# Patient Record
Sex: Male | Born: 1985
Health system: Southern US, Community
[De-identification: ages and names within clinical notes are randomized; demographics above are authoritative.]

## PROBLEM LIST (undated history)

## (undated) DIAGNOSIS — E079 Disorder of thyroid, unspecified: Secondary | ICD-10-CM

## (undated) HISTORY — DX: Disorder of thyroid, unspecified: E07.9

---
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
## 2010-06-02 ENCOUNTER — Ambulatory Visit: Payer: Self-pay | Admitting: Family Medicine

## 2013-11-26 ENCOUNTER — Other Ambulatory Visit: Payer: Self-pay | Admitting: Physician Assistant

## 2013-11-26 DIAGNOSIS — E041 Nontoxic single thyroid nodule: Secondary | ICD-10-CM

## 2013-11-27 ENCOUNTER — Ambulatory Visit
Admission: RE | Admit: 2013-11-27 | Discharge: 2013-11-27 | Disposition: A | Discharge status: Home / Self Care | Payer: BC Managed Care – PPO | Source: Ambulatory Visit | Attending: Physician Assistant | Admitting: Physician Assistant

## 2013-11-27 DIAGNOSIS — E041 Nontoxic single thyroid nodule: Secondary | ICD-10-CM

## 2014-11-24 IMAGING — US US SOFT TISSUE HEAD/NECK
1 series · 14 of 25 positions shown · non-contrast
Comparison: None.

CLINICAL DATA: Thyroid nodule and hypothyroidism.

EXAM:
THYROID ULTRASOUND
TECHNIQUE: Ultrasound examination of the thyroid gland and adjacent soft
tissues was performed.

[Series 1: us soft tissue head/neck · 0.06mm/px · 14 of 47 slices shown]
[im 1/47]
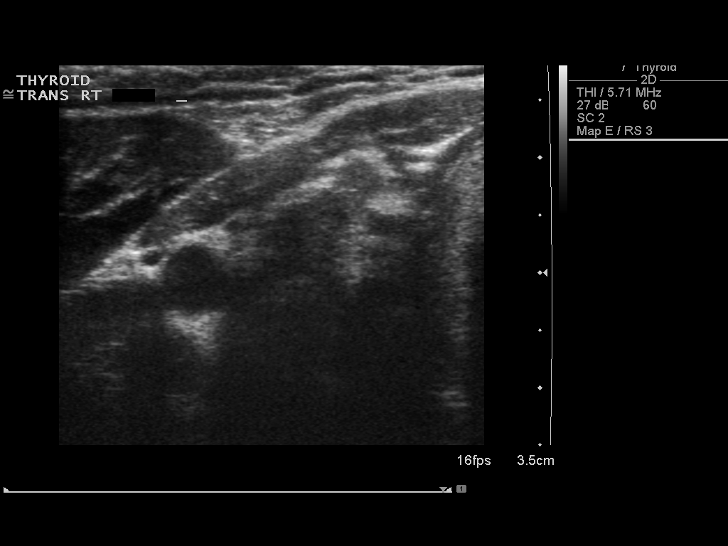
[im 4/47]
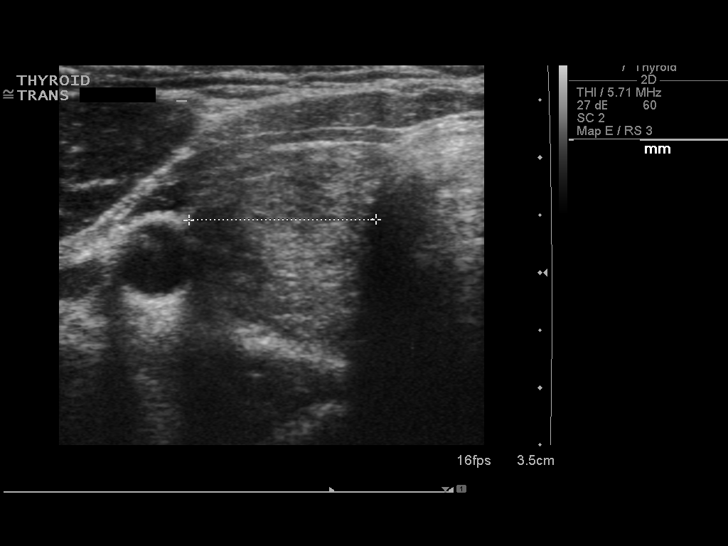
[im 8/47]
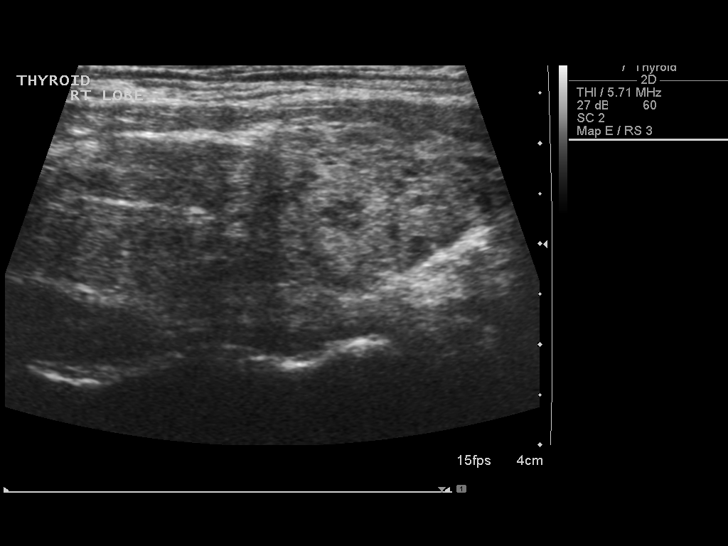
[im 12/47]
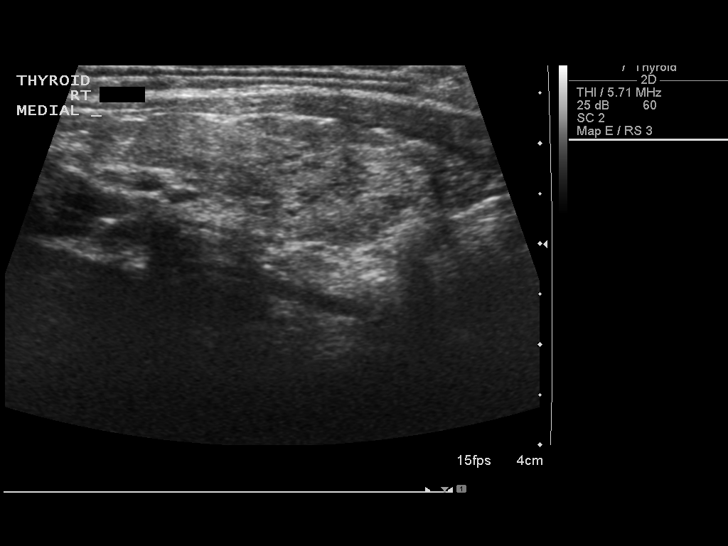
[im 16/47]
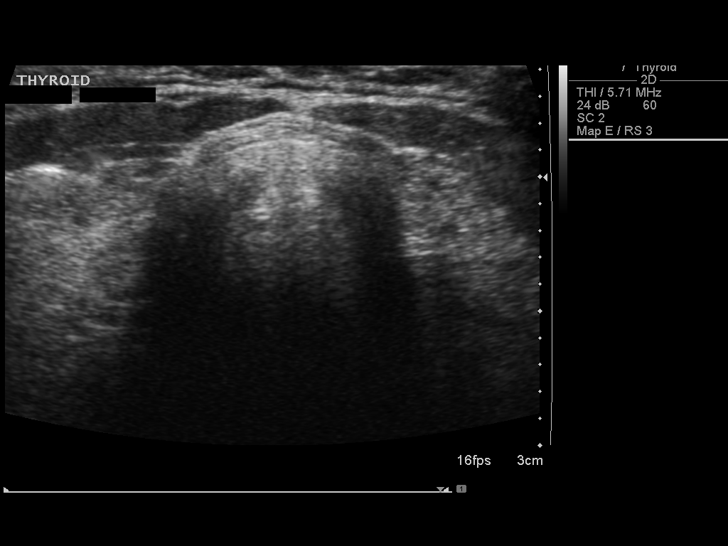
[im 18/47]
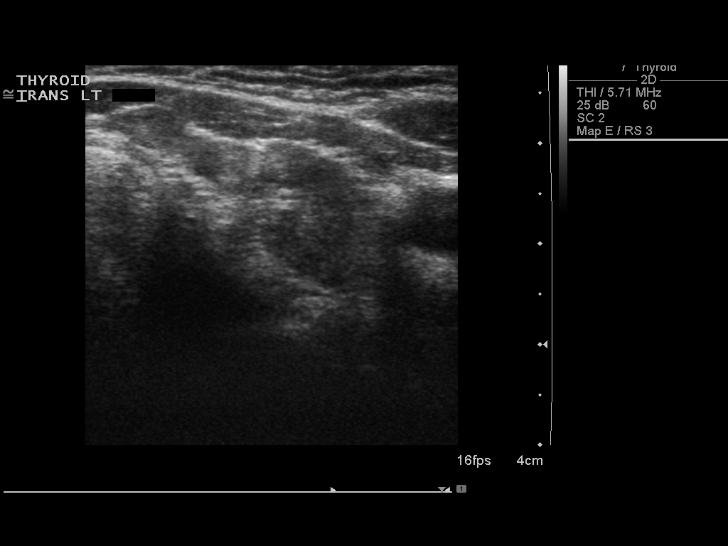
[im 22/47]
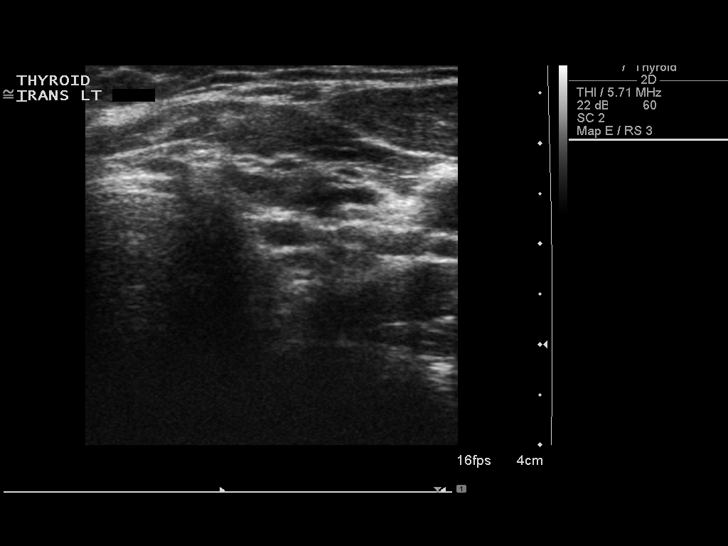
[im 25/47]
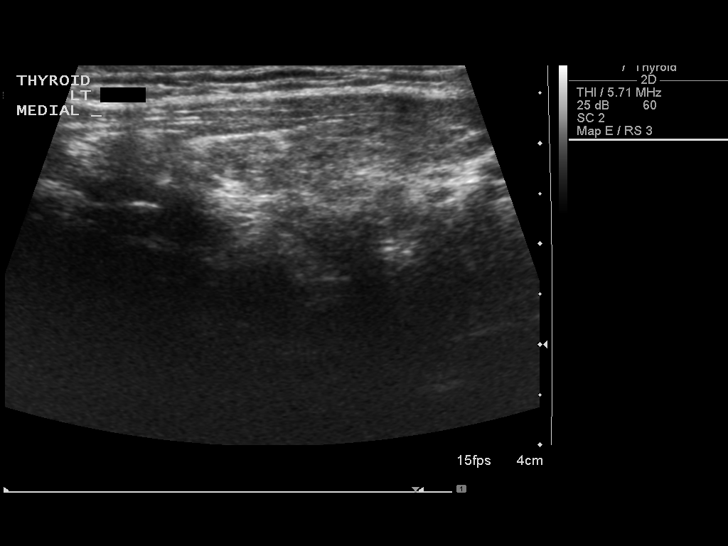
[im 29/47]
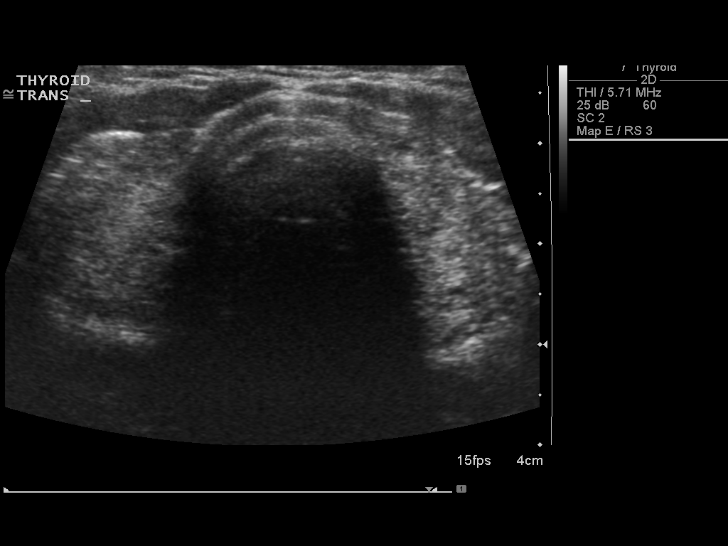
[im 31/47]
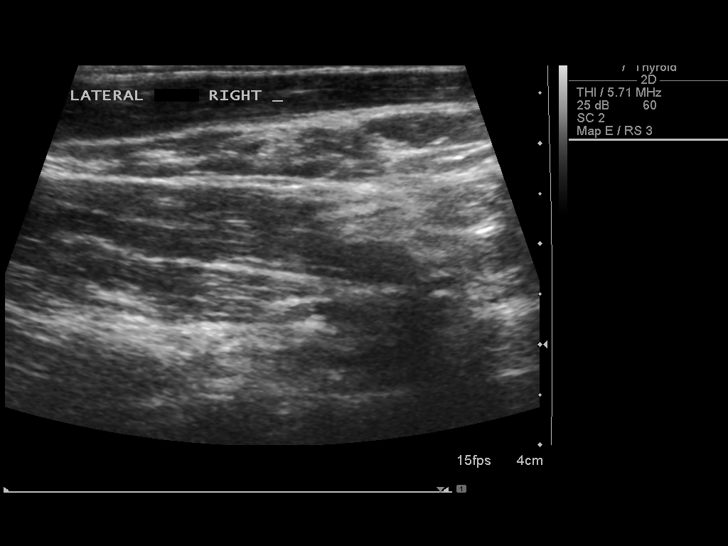
[im 35/47]
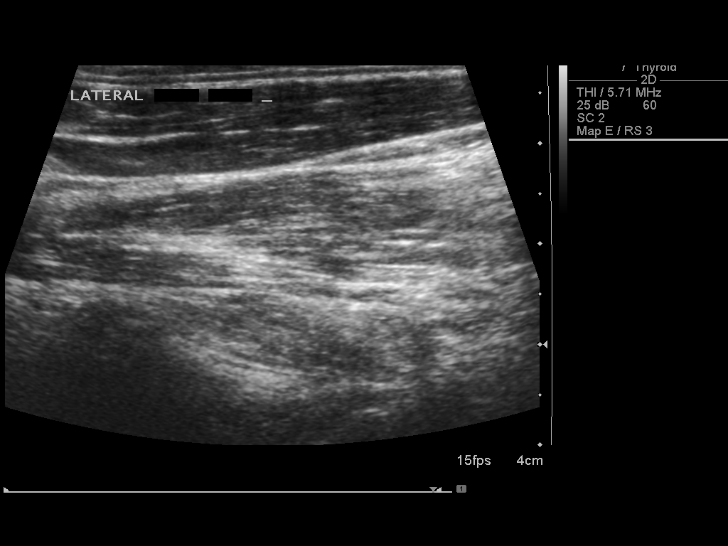
[im 39/47]
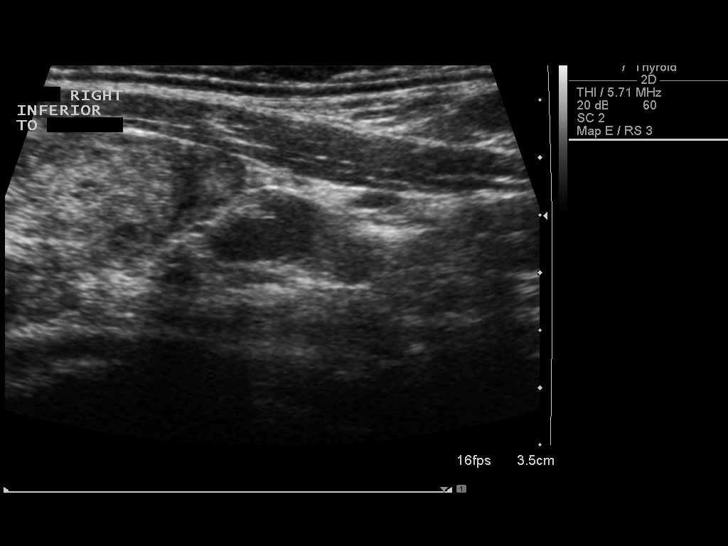
[im 43/47]
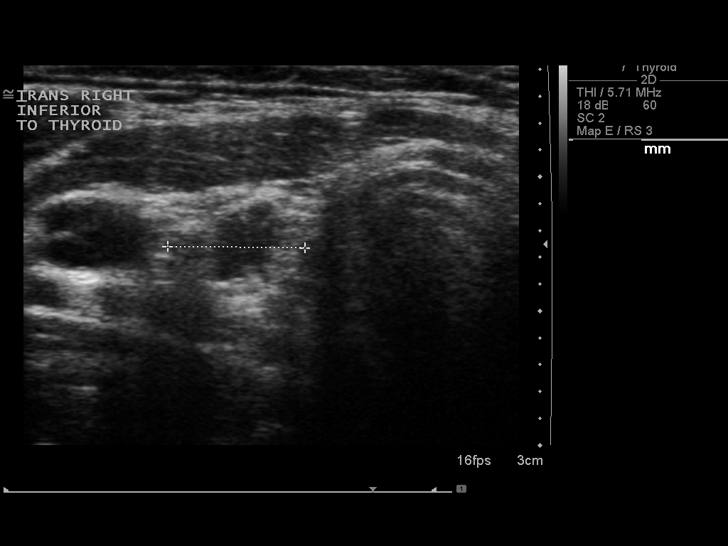
[im 47/47]
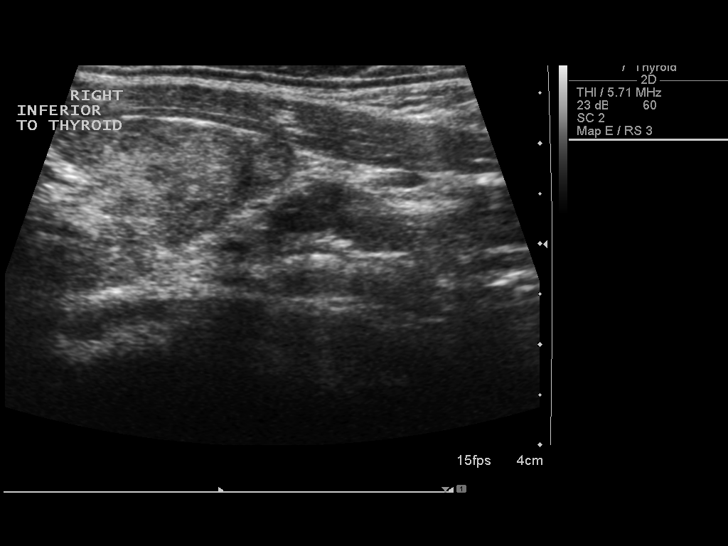

[14 of 25 positions shown; findings below may reference images not displayed]

FINDINGS: Right thyroid lobe

Measurements: 6.5 x 2.0 x 1.6 cm. Thyroid echotexture is
heterogeneous. No solid or cystic nodules are identified.

Left thyroid lobe

Measurements: 4.9 x 1.8 x 0 2.0 cm. Thyroid echotexture is
heterogeneous. No solid or cystic nodules are identified.

Isthmus

Thickness: 0.1 cm.  No nodules visualized.

Lymphadenopathy

None visualized.

Hypoechoic nodule just inferior to the right lobe of the thyroid
gland measures approximately 1.4 x 0.7 x 1.0 cm. This likely
represents a lymph node. This is less likely a parathyroid
gland/nodule.
IMPRESSION: Unremarkable thyroid ultrasound with no thyroid nodules or cysts
identified. Soft tissue nodule just inferior to the right lobe
likely represents a small lymph node.

## 2017-04-10 DIAGNOSIS — E785 Hyperlipidemia, unspecified: Secondary | ICD-10-CM | POA: Diagnosis not present

## 2017-04-10 DIAGNOSIS — E039 Hypothyroidism, unspecified: Secondary | ICD-10-CM | POA: Diagnosis not present

## 2017-04-10 DIAGNOSIS — Z0000 Encounter for general adult medical examination without abnormal findings: Secondary | ICD-10-CM | POA: Diagnosis not present

## 2017-12-25 DIAGNOSIS — E7800 Pure hypercholesterolemia, unspecified: Secondary | ICD-10-CM | POA: Diagnosis not present

## 2017-12-25 DIAGNOSIS — E039 Hypothyroidism, unspecified: Secondary | ICD-10-CM | POA: Diagnosis not present

## 2017-12-25 DIAGNOSIS — E291 Testicular hypofunction: Secondary | ICD-10-CM | POA: Diagnosis not present

## 2017-12-25 DIAGNOSIS — R7989 Other specified abnormal findings of blood chemistry: Secondary | ICD-10-CM | POA: Diagnosis not present

## 2018-01-16 DIAGNOSIS — E291 Testicular hypofunction: Secondary | ICD-10-CM | POA: Diagnosis not present

## 2018-01-16 DIAGNOSIS — R5381 Other malaise: Secondary | ICD-10-CM | POA: Diagnosis not present

## 2018-01-16 DIAGNOSIS — R5383 Other fatigue: Secondary | ICD-10-CM | POA: Diagnosis not present

## 2018-01-16 DIAGNOSIS — E039 Hypothyroidism, unspecified: Secondary | ICD-10-CM | POA: Diagnosis not present

## 2018-01-16 DIAGNOSIS — E559 Vitamin D deficiency, unspecified: Secondary | ICD-10-CM | POA: Diagnosis not present

## 2018-01-16 DIAGNOSIS — E663 Overweight: Secondary | ICD-10-CM | POA: Diagnosis not present

## 2018-02-13 DIAGNOSIS — E291 Testicular hypofunction: Secondary | ICD-10-CM | POA: Diagnosis not present

## 2018-02-13 DIAGNOSIS — R5383 Other fatigue: Secondary | ICD-10-CM | POA: Diagnosis not present

## 2018-02-13 DIAGNOSIS — E063 Autoimmune thyroiditis: Secondary | ICD-10-CM | POA: Diagnosis not present

## 2018-02-13 DIAGNOSIS — E559 Vitamin D deficiency, unspecified: Secondary | ICD-10-CM | POA: Diagnosis not present

## 2018-02-13 DIAGNOSIS — E663 Overweight: Secondary | ICD-10-CM | POA: Diagnosis not present

## 2018-09-18 DIAGNOSIS — E063 Autoimmune thyroiditis: Secondary | ICD-10-CM | POA: Diagnosis not present

## 2018-09-18 DIAGNOSIS — R5383 Other fatigue: Secondary | ICD-10-CM | POA: Diagnosis not present

## 2018-09-18 DIAGNOSIS — E291 Testicular hypofunction: Secondary | ICD-10-CM | POA: Diagnosis not present

## 2018-09-18 DIAGNOSIS — E663 Overweight: Secondary | ICD-10-CM | POA: Diagnosis not present

## 2018-09-18 DIAGNOSIS — E559 Vitamin D deficiency, unspecified: Secondary | ICD-10-CM | POA: Diagnosis not present

## 2018-10-09 DIAGNOSIS — E291 Testicular hypofunction: Secondary | ICD-10-CM | POA: Diagnosis not present

## 2018-10-09 DIAGNOSIS — R5383 Other fatigue: Secondary | ICD-10-CM | POA: Diagnosis not present

## 2018-10-09 DIAGNOSIS — E063 Autoimmune thyroiditis: Secondary | ICD-10-CM | POA: Diagnosis not present

## 2018-10-09 DIAGNOSIS — E559 Vitamin D deficiency, unspecified: Secondary | ICD-10-CM | POA: Diagnosis not present

## 2018-10-26 DIAGNOSIS — Z23 Encounter for immunization: Secondary | ICD-10-CM | POA: Diagnosis not present

## 2019-01-02 DIAGNOSIS — Z0000 Encounter for general adult medical examination without abnormal findings: Secondary | ICD-10-CM | POA: Diagnosis not present

## 2019-01-02 DIAGNOSIS — E785 Hyperlipidemia, unspecified: Secondary | ICD-10-CM | POA: Diagnosis not present

## 2019-09-02 DIAGNOSIS — E063 Autoimmune thyroiditis: Secondary | ICD-10-CM | POA: Diagnosis not present

## 2019-09-02 DIAGNOSIS — R5383 Other fatigue: Secondary | ICD-10-CM | POA: Diagnosis not present

## 2019-09-02 DIAGNOSIS — E559 Vitamin D deficiency, unspecified: Secondary | ICD-10-CM | POA: Diagnosis not present

## 2019-09-02 DIAGNOSIS — E663 Overweight: Secondary | ICD-10-CM | POA: Diagnosis not present

## 2019-09-02 DIAGNOSIS — E291 Testicular hypofunction: Secondary | ICD-10-CM | POA: Diagnosis not present

## 2019-09-16 DIAGNOSIS — R5383 Other fatigue: Secondary | ICD-10-CM | POA: Diagnosis not present

## 2019-09-16 DIAGNOSIS — E559 Vitamin D deficiency, unspecified: Secondary | ICD-10-CM | POA: Diagnosis not present

## 2019-09-16 DIAGNOSIS — E063 Autoimmune thyroiditis: Secondary | ICD-10-CM | POA: Diagnosis not present

## 2019-09-16 DIAGNOSIS — E291 Testicular hypofunction: Secondary | ICD-10-CM | POA: Diagnosis not present

## 2019-11-21 DIAGNOSIS — Z20822 Contact with and (suspected) exposure to covid-19: Secondary | ICD-10-CM | POA: Diagnosis not present

## 2020-01-05 DIAGNOSIS — R0981 Nasal congestion: Secondary | ICD-10-CM | POA: Diagnosis not present

## 2020-01-05 DIAGNOSIS — Z0000 Encounter for general adult medical examination without abnormal findings: Secondary | ICD-10-CM | POA: Diagnosis not present

## 2020-01-05 DIAGNOSIS — E785 Hyperlipidemia, unspecified: Secondary | ICD-10-CM | POA: Diagnosis not present

## 2020-01-05 DIAGNOSIS — R21 Rash and other nonspecific skin eruption: Secondary | ICD-10-CM | POA: Diagnosis not present

## 2020-09-27 DIAGNOSIS — N469 Male infertility, unspecified: Secondary | ICD-10-CM | POA: Diagnosis not present

## 2020-10-19 DIAGNOSIS — Z3141 Encounter for fertility testing: Secondary | ICD-10-CM | POA: Diagnosis not present

## 2020-11-17 DIAGNOSIS — E063 Autoimmune thyroiditis: Secondary | ICD-10-CM | POA: Diagnosis not present

## 2020-11-17 DIAGNOSIS — R5383 Other fatigue: Secondary | ICD-10-CM | POA: Diagnosis not present

## 2020-11-17 DIAGNOSIS — E559 Vitamin D deficiency, unspecified: Secondary | ICD-10-CM | POA: Diagnosis not present

## 2021-01-26 DIAGNOSIS — L439 Lichen planus, unspecified: Secondary | ICD-10-CM | POA: Diagnosis not present

## 2021-01-26 DIAGNOSIS — D485 Neoplasm of uncertain behavior of skin: Secondary | ICD-10-CM | POA: Diagnosis not present

## 2021-05-25 DIAGNOSIS — D225 Melanocytic nevi of trunk: Secondary | ICD-10-CM | POA: Diagnosis not present

## 2021-05-25 DIAGNOSIS — Z808 Family history of malignant neoplasm of other organs or systems: Secondary | ICD-10-CM | POA: Diagnosis not present

## 2021-05-25 DIAGNOSIS — L439 Lichen planus, unspecified: Secondary | ICD-10-CM | POA: Diagnosis not present

## 2021-06-04 ENCOUNTER — Ambulatory Visit
Admission: EM | Admit: 2021-06-04 | Discharge: 2021-06-04 | Disposition: A | Discharge status: Home / Self Care | Payer: BC Managed Care – PPO | Attending: Physician Assistant | Admitting: Physician Assistant

## 2021-06-04 ENCOUNTER — Encounter: Payer: Self-pay | Admitting: Emergency Medicine

## 2021-06-04 ENCOUNTER — Other Ambulatory Visit: Payer: Self-pay

## 2021-06-04 DIAGNOSIS — S81812A Laceration without foreign body, left lower leg, initial encounter: Secondary | ICD-10-CM | POA: Diagnosis not present

## 2021-06-04 NOTE — ED Triage Notes (Signed)
Pt here for laceration to left lower leg yesterday from sheet metal; bleeding controlled; pt placed steri strips on area

## 2021-06-04 NOTE — Discharge Instructions (Addendum)
Staple removal in 8 days  

## 2021-06-04 NOTE — ED Provider Notes (Signed)
[facility]    CSN: [id number] Arrival date & time: 06/04/21  9528      History   Chief Complaint Chief Complaint  Patient presents with   Laceration    HPI Kyle Callahan is a 35 y.o. male.   The history is provided by the patient. No language interpreter was used.  Laceration Location:  Leg Leg laceration location:  L lower leg Length:  2.5 Depth:  Cutaneous Quality: straight   Bleeding: uncontrolled   Laceration mechanism:  Metal edge Pain details:    Quality:  Aching   Severity:  Moderate   Timing:  Constant   Progression:  Worsening Foreign body present:  No foreign bodies Relieved by:  Nothing Worsened by:  Nothing Tetanus status:  Up to date  History reviewed. No pertinent past medical history.  There are no problems to display for this patient.   History reviewed. No pertinent surgical history.     Home Medications    Prior to Admission medications   Not on File    Family History History reviewed. No pertinent family history.  Social History Social History   Tobacco Use   Smoking status: Never   Smokeless tobacco: Never  Substance Use Topics   Alcohol use: Not Currently   Drug use: Never     Allergies   Patient has no known allergies.   Review of Systems Review of Systems  Skin:  Positive for wound.  All other systems reviewed and are negative.   Physical Exam Triage Vital Signs ED Triage Vitals  Enc Vitals Group     BP 06/04/21 0915 122/84     Pulse Rate 06/04/21 0915 63     Resp 06/04/21 0915 18     Temp 06/04/21 0915 98.1 F (36.7 C)     Temp Source 06/04/21 0915 Oral     SpO2 06/04/21 0915 95 %     Weight --      Height --      Head Circumference --      Peak Flow --      Pain Score 06/04/21 0916 7     Pain Loc --      Pain Edu? --      Excl. in GC? --    No data found.  Updated Vital Signs BP 122/84 (BP Location: Left Arm)   Pulse 63   Temp 98.1 F (36.7 C) (Oral)   Resp 18   SpO2  95%   Visual Acuity Right Eye Distance:   Left Eye Distance:   Bilateral Distance:    Right Eye Near:   Left Eye Near:    Bilateral Near:     Physical Exam Vitals reviewed.  Musculoskeletal:        General: Normal range of motion.  Skin:    Comments: 2.5 cm laceration left lower leg   Neurological:     General: No focal deficit present.     Mental Status: He is alert.  Psychiatric:        Mood and Affect: Mood normal.     UC Treatments / Results  Labs (all labs ordered are listed, but only abnormal results are displayed) Labs Reviewed - No data to display  EKG   Radiology No results found.  Procedures Laceration Repair  Date/Time: 06/04/2021 9:46 AM Performed by: Elson Areas, PA-C Authorized by: Elson Areas, PA-C   Consent:    Consent obtained:  Verbal   Consent given by:  Patient   Risks discussed:  Infection Universal protocol:    Patient identity confirmed:  Verbally with patient Anesthesia:    Anesthesia method:  Local infiltration Laceration details:    Location:  Leg   Leg location:  L lower leg   Length (cm):  2.5   Depth (mm):  5 Pre-procedure details:    Preparation:  Patient was prepped and draped in usual sterile fashion Exploration:    Limited defect created (wound extended): no     Imaging outcome: foreign body not noted     Contaminated: no   Treatment:    Area cleansed with:  Povidone-iodine   Layers/structures repaired:  Deep subcutaneous Skin repair:    Repair method:  Staples   Number of staples:  4 Approximation:    Approximation:  Loose Repair type:    Repair type:  Simple Post-procedure details:    Procedure completion:  Tolerated (including critical care time)  Medications Ordered in UC Medications - No data to display  Initial Impression / Assessment and Plan / UC Course  I have reviewed the triage vital signs and the nursing notes.  Pertinent labs & imaging results that were available during my care of the  patient were reviewed by me and considered in my medical decision making (see chart for details).     MDM  Pt counseled on staples and wound care Final Clinical Impressions(s) / UC Diagnoses   Final diagnoses:  Laceration of left lower extremity, initial encounter     Discharge Instructions      Staple removal in 8 days   ED Prescriptions   None    PDMP not reviewed this encounter. An After Visit Summary was printed and given to the patient.    Elson Areas, [place] 06/04/21 [phone]

## 2021-06-07 DIAGNOSIS — Z31440 Encounter of male for testing for genetic disease carrier status for procreative management: Secondary | ICD-10-CM | POA: Diagnosis not present

## 2021-06-07 DIAGNOSIS — Z113 Encounter for screening for infections with a predominantly sexual mode of transmission: Secondary | ICD-10-CM | POA: Diagnosis not present

## 2021-06-11 ENCOUNTER — Other Ambulatory Visit: Payer: Self-pay

## 2021-06-11 ENCOUNTER — Encounter: Payer: Self-pay | Admitting: Emergency Medicine

## 2021-06-11 ENCOUNTER — Ambulatory Visit
Admission: EM | Admit: 2021-06-11 | Discharge: 2021-06-11 | Disposition: A | Discharge status: Home / Self Care | Payer: BC Managed Care – PPO

## 2021-06-11 DIAGNOSIS — Z4802 Encounter for removal of sutures: Secondary | ICD-10-CM

## 2021-06-11 NOTE — ED Triage Notes (Signed)
Leg laceration, wound edges well approximated, no redness, swelling, drainage visible. 4 staples to be removed

## 2021-07-05 DIAGNOSIS — Z3189 Encounter for other procreative management: Secondary | ICD-10-CM | POA: Diagnosis not present

## 2021-09-01 DIAGNOSIS — J342 Deviated nasal septum: Secondary | ICD-10-CM | POA: Diagnosis not present

## 2021-09-01 DIAGNOSIS — K219 Gastro-esophageal reflux disease without esophagitis: Secondary | ICD-10-CM | POA: Diagnosis not present

## 2021-09-01 DIAGNOSIS — R0981 Nasal congestion: Secondary | ICD-10-CM | POA: Diagnosis not present

## 2021-09-12 DIAGNOSIS — J342 Deviated nasal septum: Secondary | ICD-10-CM | POA: Diagnosis not present

## 2021-09-12 DIAGNOSIS — J3489 Other specified disorders of nose and nasal sinuses: Secondary | ICD-10-CM | POA: Diagnosis not present

## 2021-09-12 DIAGNOSIS — K219 Gastro-esophageal reflux disease without esophagitis: Secondary | ICD-10-CM | POA: Diagnosis not present

## 2021-09-12 DIAGNOSIS — R0981 Nasal congestion: Secondary | ICD-10-CM | POA: Diagnosis not present

## 2023-06-23 ENCOUNTER — Other Ambulatory Visit: Payer: Self-pay

## 2023-06-23 ENCOUNTER — Encounter (HOSPITAL_COMMUNITY): Payer: Self-pay | Admitting: *Deleted

## 2023-06-23 ENCOUNTER — Emergency Department (HOSPITAL_COMMUNITY)
Admission: EM | Admit: 2023-06-23 | Discharge: 2023-06-23 | Disposition: A | Discharge status: Home / Self Care | Payer: BC Managed Care – PPO | Attending: Emergency Medicine | Admitting: Emergency Medicine

## 2023-06-23 ENCOUNTER — Emergency Department (HOSPITAL_COMMUNITY): Payer: BC Managed Care – PPO

## 2023-06-23 DIAGNOSIS — R42 Dizziness and giddiness: Secondary | ICD-10-CM | POA: Insufficient documentation

## 2023-06-23 DIAGNOSIS — R55 Syncope and collapse: Secondary | ICD-10-CM | POA: Insufficient documentation

## 2023-06-23 DIAGNOSIS — R110 Nausea: Secondary | ICD-10-CM | POA: Insufficient documentation

## 2023-06-23 DIAGNOSIS — R61 Generalized hyperhidrosis: Secondary | ICD-10-CM | POA: Insufficient documentation

## 2023-06-23 LAB — BASIC METABOLIC PANEL
Anion gap: 11 (ref 5–15)
BUN: 14 mg/dL (ref 6–20)
CO2: 25 mmol/L (ref 22–32)
Calcium: 9.8 mg/dL (ref 8.9–10.3)
Chloride: 100 mmol/L (ref 98–111)
Creatinine, Ser: 1.05 mg/dL (ref 0.61–1.24)
GFR, Estimated: >60 mL/min (ref >60)
Glucose, Bld: 106 mg/dL — ABNORMAL HIGH (ref 70–99)
Potassium: 3.7 mmol/L (ref 3.5–5.1)
Sodium: 136 mmol/L (ref 135–145)

## 2023-06-23 LAB — CBC
HCT: 43.6 % (ref 39.0–52.0)
Hemoglobin: 14.5 g/dL (ref 13.0–17.0)
MCH: 30.3 pg (ref 26.0–34.0)
MCHC: 33.3 g/dL (ref 30.0–36.0)
MCV: 91 fL (ref 80.0–100.0)
Platelets: 160 10*3/uL (ref 150–400)
RBC: 4.79 MIL/uL (ref 4.22–5.81)
RDW: 13.8 % (ref 11.5–15.5)
WBC: 4.3 10*3/uL (ref 4.0–10.5)
nRBC: 0 % (ref 0.0–0.2)

## 2023-06-23 LAB — TROPONIN I (HIGH SENSITIVITY)
Troponin I (High Sensitivity): 3 ng/L (ref <18)
Troponin I (High Sensitivity): <2 ng/L (ref <18)

## 2023-06-23 NOTE — ED Triage Notes (Signed)
The pt passed out earlier tonight that lasted for 60 seconds according to his wife  he has had this for  years since he was a teenager

## 2023-06-23 NOTE — Discharge Instructions (Addendum)
Your work up in the ED was reassuring. We recommend follow up with your primary care doctor. You may benefit from Cardiology follow up and an outpatient echocardiogram. Return to the ED for new or concerning symptoms.

## 2023-06-25 ENCOUNTER — Encounter: Payer: Self-pay | Admitting: Cardiology

## 2023-06-25 ENCOUNTER — Ambulatory Visit: Payer: BC Managed Care – PPO | Attending: Cardiology | Admitting: Cardiology

## 2023-06-25 VITALS — BP 100/68 | HR 77 | Ht 70.0 in | Wt 187.0 lb

## 2023-06-25 DIAGNOSIS — R55 Syncope and collapse: Secondary | ICD-10-CM | POA: Diagnosis not present

## 2023-06-25 NOTE — Progress Notes (Signed)
Cardiology Office Note:    Date:  06/25/2023   ID:  Kyle Callahan, DOB [birth year], MRN [id number]  PCP:  [facility]   [facility] Providers Cardiologist:  None     Referring MD: [facility]   Chief Complaint  Patient presents with   Loss of Consciousness    History of Present Illness:    Kyle Callahan is a 37 y.o. male seen at the request of Dr Nicanor Alcon for evaluation of syncope. He was seen in the ED on August 10 for evaluation of syncope. Reports he has had 10-12 of these episodes since he was in his early teens. They all occurred in similar fashion. He awakes in the middle of the night with acute epigastric pain and feels like he needs to have a BM. Later has acute nausea. Went to [place]. Felt lightheaded and sweaty/clammy then passed out. Was out about a minute according to his wife. No confusion after. No incontinence. Usually feels fine after and Abdominal pain is gone. No change in diet to explain symptoms. In ED. BMET, CBC, troponins, CXR and Ecg all normal. He walks daily. Does some body weight training. No palpitations.   Past Medical History:  Diagnosis Date   Thyroid disease     History reviewed. No pertinent surgical history.  Current Medications: Current Meds  Medication Sig   NP THYROID 90 MG tablet Take 90 mg by mouth daily.   SYNTHROID 150 MCG tablet Take 150 mcg by mouth daily.     Allergies:   Patient has no known allergies.   Social History   Socioeconomic History   Marital status: Married    Spouse name: Not on file   Number of children: 1   Years of education: Not on file   Highest education level: Not on file  Occupational History   Not on file  Tobacco Use   Smoking status: Never   Smokeless tobacco: Never  Substance and Sexual Activity   Alcohol use: Not Currently   Drug use: Never   Sexual activity: Not on file  Other Topics Concern   Not on file  Social History Narrative   Insurance -  health, life   Social Determinants of Health   Financial Resource Strain: Not on file  Food Insecurity: Not on file  Transportation Needs: Not on file  Physical Activity: Not on file  Stress: Not on file  Social Connections: Unknown (03/28/2022)   Received from [facility]   Social Network    Social Network: Not on file     Family History: The patient's family history includes Heart attack (age of onset: 24) in his father; Stroke in his brother.  ROS:   Please see the history of present illness.     All other systems reviewed and are negative.  EKGs/Labs/Other Studies Reviewed:    The following studies were reviewed today:       Recent Labs: 06/23/2023: BUN 14; Creatinine, Ser 1.05; Hemoglobin 14.5; Platelets 160; Potassium 3.7; Sodium 136  Recent Lipid Panel No results found for: "CHOL", "TRIG", "HDL", "CHOLHDL", "VLDL", "LDLCALC", "LDLDIRECT"   Risk Assessment/Calculations:                Physical Exam:    VS:  BP 100/68 (BP Location: Left Arm, Patient Position: Sitting, Cuff Size: Normal)   Pulse 77   Ht 5\' 10"  (1.778 m)   Wt 187 lb (84.8 kg)   SpO2 96%  BMI 26.83 kg/m     Wt Readings from Last 3 Encounters:  06/25/23 187 lb (84.8 kg)  06/23/23 184 lb (83.5 kg)     GEN:  Well nourished, well developed in no acute distress HEENT: Normal NECK: No JVD; No carotid bruits LYMPHATICS: No lymphadenopathy CARDIAC: RRR, no murmurs, rubs, gallops RESPIRATORY:  Clear to auscultation without rales, wheezing or rhonchi  ABDOMEN: Soft, non-tender, non-distended MUSCULOSKELETAL:  No edema; No deformity  SKIN: Warm and dry NEUROLOGIC:  Alert and oriented x 3 PSYCHIATRIC:  Normal affect   ASSESSMENT:    1. Vasovagal syncope    PLAN:    In order of problems listed above:  Vasovagal syncope. Classic history. Increased vagal tone at night then exacerbated by acute GI pain and cramping. Normal exam and Ecg. Explained nature of vasovagal syncope. Appears to  have somewhat spastic GI component as trigger. Explained that with warning signs needs to get prone and elevate legs. No further cardiac work up needed. Will see PRN           Medication Adjustments/Labs and Tests Ordered: Current medicines are reviewed at length with the patient today.  Concerns regarding medicines are outlined above.  No orders of the defined types were placed in this encounter.  No orders of the defined types were placed in this encounter.   Patient Instructions  Medication Instructions:  Continue same medication   Lab Work: None ordered    Testing/Procedures: None ordered   Follow-Up: At [facility] you and your health needs are our priority.  As part of our continuing mission to provide you with exceptional heart care, we have created designated Provider Care Teams.  These Care Teams include your primary Cardiologist (physician) and Advanced Practice Providers (APPs -  Physician Assistants and Nurse Practitioners) who all work together to provide you with the care you need, when you need it.  We recommend signing up for the patient portal called "MyChart".  Sign up information is provided on this After Visit Summary.  MyChart is used to connect with patients for Virtual Visits (Telemedicine).  Patients are able to view lab/test results, encounter notes, upcoming appointments, etc.  Non-urgent messages can be sent to your provider as well.   To learn more about what you can do with MyChart, go to [email or web address].    Your next appointment:  As Needed    Provider: Dr.Granite Godman     Signed, Shaely Gadberry [place], MD  06/25/2023 5:27 PM    [facility]

## 2023-06-25 NOTE — Patient Instructions (Signed)
Medication Instructions:  Continue same medication   Lab Work: None ordered    Testing/Procedures: None ordered   Follow-Up: At [facility], you and your health needs are our priority.  As part of our continuing mission to provide you with exceptional heart care, we have created designated Provider Care Teams.  These Care Teams include your primary Cardiologist (physician) and Advanced Practice Providers (APPs -  Physician Assistants and Nurse Practitioners) who all work together to provide you with the care you need, when you need it.  We recommend signing up for the patient portal called "MyChart".  Sign up information is provided on this After Visit Summary.  MyChart is used to connect with patients for Virtual Visits (Telemedicine).  Patients are able to view lab/test results, encounter notes, upcoming appointments, etc.  Non-urgent messages can be sent to your provider as well.   To learn more about what you can do with MyChart, go to [email or web address].    Your next appointment:  As Needed    Provider: Dr.Jordan

## 2023-07-11 NOTE — ED Provider Notes (Signed)
[facility] Provider Note   CSN: [id number] Arrival date & time: 06/23/23  0018     History  Chief Complaint  Patient presents with   Loss of Consciousness    RAYMAN HOEPNER is a 37 y.o. male.  37 year old male presents to the emergency department for evaluation of a syncopal episode.  He was with his wife when he passed out.  Took about 60 seconds before patient regained consciousness.  He reports prodromal symptoms of nausea, diaphoresis, lightheadedness prior to his syncope.  States that these episodes often happen at nighttime.  No witnessed seizure-like activity.  No tongue biting or incontinence, vomiting.  Denies preceding chest pain, shortness of breath.  States that he has been eating regular meals and getting routine sleep.  No use of alcohol or illicit substances.  He has been experiencing similar syncopal events since he was a teenager.  Has never had this evaluated.  No FHx of sudden cardiac death.  The history is provided by the patient. No language interpreter was used.  Loss of Consciousness      Home Medications Prior to Admission medications   Medication Sig Start Date End Date Taking? Authorizing Provider  NP THYROID 90 MG tablet Take 90 mg by mouth daily. 08/16/20   [provider]  SYNTHROID 150 MCG tablet Take 150 mcg by mouth daily. 07/28/21   [provider]      Allergies    Patient has no known allergies.    Review of Systems   Review of Systems  Cardiovascular:  Positive for syncope.  Ten systems reviewed and are negative for acute change, except as noted in the HPI.    Physical Exam Updated Vital Signs BP 122/67 (BP Location: Right Arm)   Pulse 67   Temp 98.3 F (36.8 C) (Oral)   Resp 16   Ht 5\' 11"  (1.803 m)   Wt 83.5 kg   SpO2 100%   BMI 25.66 kg/m   Physical Exam Vitals and nursing note reviewed.  Constitutional:      General: He is not in acute distress.     Appearance: He is well-developed. He is not diaphoretic.     Comments: Nontoxic appearing and in NAD  HENT:     Head: Normocephalic and atraumatic.  Eyes:     General: No scleral icterus.    Conjunctiva/sclera: Conjunctivae normal.  Pulmonary:     Effort: Pulmonary effort is normal. No respiratory distress.     Comments: Respirations even and unlabored Musculoskeletal:        General: Normal range of motion.     Cervical back: Normal range of motion.  Skin:    General: Skin is warm and dry.     Coloration: Skin is not pale.     Findings: No erythema or rash.  Neurological:     Mental Status: He is alert and oriented to person, place, and time.     Cranial Nerves: No cranial nerve deficit.     Coordination: Coordination normal.     Comments: GCS 15. Speech is goal oriented. No deficits appreciated to CN III-XII; symmetric eyebrow raise, no facial drooping, tongue midline. Patient has equal grip strength bilaterally with 5/5 strength against resistance in all major muscle groups bilaterally. Sensation to light touch intact. Patient moves extremities without ataxia. Patient ambulatory with steady gait in the ED.  Psychiatric:        Behavior: Behavior normal.  ED Results / Procedures / Treatments   Labs (all labs ordered are listed, but only abnormal results are displayed) Labs Reviewed  BASIC METABOLIC PANEL - Abnormal; Notable for the following components:      Result Value   Glucose, Bld 106 (*)    All other components within normal limits  CBC  TROPONIN I (HIGH SENSITIVITY)  TROPONIN I (HIGH SENSITIVITY)    EKG EKG Interpretation Date/Time:  Saturday June 23 2023 00:39:22 EDT Ventricular Rate:  78 PR Interval:  126 QRS Duration:  100 QT Interval:  368 QTC Calculation: 419 R Axis:   87  Text Interpretation: Normal sinus rhythm Normal ECG No previous ECGs available Confirmed by Benjiman Core [phone]) on 06/24/2023 8:00:02 PM  Radiology DG Chest 2  View  Result Date: 06/23/2023 CLINICAL DATA:  Syncope EXAM: CHEST - 2 VIEW COMPARISON:  None. FINDINGS: Lungs are clear.  No pleural effusion or pneumothorax. The heart is normal in size. Visualized osseous structures are within normal limits. IMPRESSION: Normal chest radiographs. Electronically Signed   By: Charline Bills M.D.   On: 06/23/2023 01:05     Procedures Procedures    Medications Ordered in ED Medications - No data to display  ED Course/ Medical Decision Making/ A&P                                 Medical Decision Making  This patient presents to the ED for concern of syncope, this involves an extensive number of treatment options, and is a complaint that carries with it a high risk of complications and morbidity.  The differential diagnosis includes vasovagal syncope vs arrhythmia vs electrolyte derangement vs hypovolemia/dehydration vs seizure   Co morbidities that complicate the patient evaluation  None known   Additional history obtained:  Additional history obtained from mother, at bedside   Lab Tests:  I Ordered, and personally interpreted labs.  The pertinent results include:  Normal CBC, BMP. Troponin negative.   Imaging Studies ordered:  I ordered imaging studies including CXR  I independently visualized and interpreted imaging which showed no acute cardiopulmonary abnormality I agree with the radiologist interpretation   Cardiac Monitoring:  The patient was maintained on a cardiac monitor.  I personally viewed and interpreted the cardiac monitored which showed an underlying rhythm of: NSR   Medicines ordered and prescription drug management:  I have reviewed the patients home medicines and have made adjustments as needed   Problem List / ED Course:  37 year old male presents to the emergency department for evaluation of a syncopal event.  He has been experiencing similar episodes of syncope since he was a teenager.  No clear inciting  symptoms, but states that they often happen at nighttime. This episode was witnessed by the patient's wife.  There is no seizure-like activity, tongue biting, incontinence.  No head trauma from the fall. Neurologic exam nonfocal in the emergency department.  His evaluation has been reassuring.  No anemia, electrolyte derangements.  EKG without evidence of arrhythmia.  He has no preceding chest pain or shortness of breath with his syncope.  Is presently back to baseline and asymptomatic. [place] syncope score negative Denies family history of sudden cardiac death, but given recurrent episodes will plan to refer to cardiology for ongoing evaluation.  May benefit from echocardiogram or Holter monitor.  Encouraged to return for new or concerning symptoms.   Reevaluation:  After the interventions noted above,  I reevaluated the patient and found that they have :improved   Social Determinants of Health:  Good social support   Dispostion:  After consideration of the diagnostic results and the patients response to treatment, I feel that the patent would benefit from outpatient PCP follow up. Given chronicity of events, low suspicion for emergent process, but will refer to Cardiology for further evaluation. May benefit from outpatient Holter monitor, echocardiogram. Return precautions discussed and provided. Patient discharged in stable condition with no unaddressed concerns.          Final Clinical Impression(s) / ED Diagnoses Final diagnoses:  Syncope, unspecified syncope type    Rx / DC Orders ED Discharge Orders     None         Antony Madura, PA-C 07/11/23 1810    Palumbo, April, MD 07/11/23 2314
